# Patient Record
Sex: Female | Born: 1990 | Race: Black or African American | Hispanic: No | Marital: Single | Smoking: Current every day smoker
Health system: Southern US, Community
[De-identification: ages and names within clinical notes are randomized; demographics above are authoritative.]

## PROBLEM LIST (undated history)

## (undated) HISTORY — PX: EYE SURGERY: SHX253

---
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
## 2010-12-18 ENCOUNTER — Inpatient Hospital Stay (INDEPENDENT_AMBULATORY_CARE_PROVIDER_SITE_OTHER)
Admission: RE | Admit: 2010-12-18 | Discharge: 2010-12-18 | Disposition: A | Discharge status: Home / Self Care | Payer: BC Managed Care – PPO | Source: Ambulatory Visit | Attending: Family Medicine | Admitting: Family Medicine

## 2010-12-18 DIAGNOSIS — R6889 Other general symptoms and signs: Secondary | ICD-10-CM

## 2011-01-19 ENCOUNTER — Emergency Department (INDEPENDENT_AMBULATORY_CARE_PROVIDER_SITE_OTHER)
Admission: EM | Admit: 2011-01-19 | Discharge: 2011-01-19 | Disposition: A | Discharge status: Home / Self Care | Payer: BC Managed Care – PPO | Source: Home / Self Care

## 2011-01-19 DIAGNOSIS — J069 Acute upper respiratory infection, unspecified: Secondary | ICD-10-CM

## 2011-01-19 MED ORDER — ONDANSETRON HCL 4 MG PO TABS: 4.0000 mg | ORAL_TABLET | Freq: Four times a day (QID) | ORAL | Status: AC

## 2011-01-19 MED ORDER — FEXOFENADINE-PSEUDOEPHED ER 60-120 MG PO TB12: 1.0000 | ORAL_TABLET | Freq: Two times a day (BID) | ORAL | Status: AC

## 2011-01-19 MED ORDER — IBUPROFEN 800 MG PO TABS: 800.0000 mg | ORAL_TABLET | Freq: Three times a day (TID) | ORAL | Status: AC

## 2011-01-19 NOTE — ED Provider Notes (Signed)
History     CSN: [id number] Arrival date & time: 01/19/2011  3:58 PM   First MD Initiated Contact with Patient 01/19/11 1522      Chief Complaint  Patient presents with  . Fever    1 day hx of fever, chills, nausea, vomitting. Had vomitting last night.  Today has had fever.  Nonproductive cough. has taken Niquil and tylenol without relief    (Consider location/radiation/quality/duration/timing/severity/associated sxs/prior treatment) Patient is a 20 y.o. female presenting with fever. The history is provided by the patient.  Fever Primary symptoms of the febrile illness include fever, cough, nausea and vomiting. Primary symptoms do not include headaches, wheezing, shortness of breath, abdominal pain or rash. The current episode started today. This is a new problem. The problem has not changed since onset. The maximum temperature recorded prior to her arrival was 100 to 100.9 F.  The cough is non-productive and vomit inducing.    History reviewed. No pertinent past medical history.  History reviewed. No pertinent past surgical history.  Family History  Problem Relation Age of Onset  . Cancer Mother   . Hypertension Mother   . Hyperlipidemia Mother     History  Substance Use Topics  . Smoking status: Current Some Day Smoker -- 0.5 packs/day    Types: Cigarettes  . Smokeless tobacco: Never Used  . Alcohol Use: Yes     occational    OB History    Grav Para Term Preterm Abortions TAB SAB Ect Mult Living                  Review of Systems  Constitutional: Positive for fever and chills. Negative for appetite change.  HENT: Positive for congestion. Negative for ear pain, sore throat, rhinorrhea, sneezing, trouble swallowing, neck pain and sinus pressure.   Eyes: Negative for discharge.  Respiratory: Positive for cough. Negative for shortness of breath and wheezing.   Gastrointestinal: Positive for nausea and vomiting. Negative for abdominal pain.       Persistent nausea  vomiting x 1 foot content last night pos tussive.  Skin: Negative for rash.  Neurological: Negative for dizziness and headaches.    Allergies  Review of patient's allergies indicates no known allergies.  Home Medications   Current Outpatient Rx  Name Route Sig Dispense Refill  . FEXOFENADINE-PSEUDOEPHEDRINE 60-120 MG PO TB12 Oral Take 1 tablet by mouth every 12 (twelve) hours. 30 tablet 0  . IBUPROFEN 800 MG PO TABS Oral Take 1 tablet (800 mg total) by mouth 3 (three) times daily. 21 tablet 0    Take with food  . ONDANSETRON HCL 4 MG PO TABS Oral Take 1 tablet (4 mg total) by mouth every 6 (six) hours. 12 tablet 0    BP 134/59  Pulse 99  Temp 100.4 F (38 C)  Resp 16  SpO2 100%  LMP 12/19/2010  Physical Exam  Nursing note and vitals reviewed. Constitutional: She is oriented to person, place, and time. She appears well-developed and well-nourished. No distress.  HENT:  Head: Normocephalic.       Significant pharyngeal erythema no exudates. No uvula deviation. No trismus. TM's with increased vascular markings no swelling or dullness. Impress clear fluid behind right TM.  Eyes: Conjunctivae and EOM are normal. Pupils are equal, round, and reactive to light. Right eye exhibits no discharge. Left eye exhibits no discharge.  Cardiovascular: Normal rate, regular rhythm, normal heart sounds and intact distal pulses.   Pulmonary/Chest: Effort normal and breath sounds normal.  No respiratory distress. She has no wheezes. She exhibits no tenderness.  Abdominal: Soft. She exhibits no distension and no mass. There is no tenderness. There is no rebound and no guarding.  Lymphadenopathy:    She has no cervical adenopathy.  Neurological: She is alert and oriented to person, place, and time.  Skin: No rash noted.    ED Course  Procedures (including critical care time)  Labs Reviewed - No data to display No results found.   1. URI (upper respiratory infection)       MDM  Treated  symptomatically.        Sharin Grave, MD 01/19/11 1700

## 2011-01-19 NOTE — ED Notes (Signed)
Patient did not answer in waiting room. 

## 2011-01-19 NOTE — ED Notes (Signed)
1 day hx of fever, chills, nausea, vomitting. Had vomitting last night.  Today has had fever.  Nonproductive cough.

## 2012-12-04 ENCOUNTER — Emergency Department (HOSPITAL_COMMUNITY)
Admission: EM | Admit: 2012-12-04 | Discharge: 2012-12-04 | Disposition: A | Discharge status: Home / Self Care | Payer: BC Managed Care – PPO | Source: Home / Self Care | Attending: Emergency Medicine | Admitting: Emergency Medicine

## 2012-12-04 ENCOUNTER — Encounter (HOSPITAL_COMMUNITY): Payer: Self-pay | Admitting: Emergency Medicine

## 2012-12-04 ENCOUNTER — Emergency Department (INDEPENDENT_AMBULATORY_CARE_PROVIDER_SITE_OTHER): Payer: BC Managed Care – PPO

## 2012-12-04 DIAGNOSIS — S93409A Sprain of unspecified ligament of unspecified ankle, initial encounter: Secondary | ICD-10-CM

## 2012-12-04 NOTE — ED Notes (Addendum)
Pt c/o of left ankle injury last night. Tripped and rolled ankle. Currently unable to bear weight and ambulate. Has taken OTC pain meds with no relief. Pt denies any numbness or tingling. Pt is alert and oriented.

## 2012-12-04 NOTE — ED Provider Notes (Signed)
CSN: [id number]     Arrival date & time 12/04/12  1515 History   First MD Initiated Contact with Patient 12/04/12 1644     Chief Complaint  Patient presents with  . Ankle Injury   (Consider location/radiation/quality/duration/timing/severity/associated sxs/prior Treatment) HPI Comments: [place], not sure if she rolled her ankle and then fell or how exactly she injured her ankle. Reports this is 3rd time injuring ankle  Patient is a 22 y.o. female presenting with lower extremity injury. The history is provided by the patient.  Ankle Injury This is a new problem. The current episode started 12 to 24 hours ago. The problem occurs constantly. The problem has not changed since onset.The symptoms are aggravated by standing and walking. Nothing relieves the symptoms. Treatments tried: ibuprofen. The treatment provided no relief.    History reviewed. No pertinent past medical history. History reviewed. No pertinent past surgical history. Family History  Problem Relation Age of Onset  . Cancer Mother   . Hypertension Mother   . Hyperlipidemia Mother    History  Substance Use Topics  . Smoking status: Current Some Day Smoker -- 0.50 packs/day    Types: Cigarettes  . Smokeless tobacco: Never Used  . Alcohol Use: Yes     Comment: occational   OB History   Grav Para Term Preterm Abortions TAB SAB Ect Mult Living                 Review of Systems  Musculoskeletal: Positive for joint swelling.       Ankle injury  Skin: Negative for color change and wound.    Allergies  Review of patient's allergies indicates no known allergies.  Home Medications  No current outpatient prescriptions on file. BP 134/82  Pulse 104  Temp(Src) 97.7 F (36.5 C) (Oral)  Resp 18  SpO2 100%  LMP 11/20/2012 Physical Exam  Constitutional: She appears well-developed and well-nourished. No distress.  Musculoskeletal:       Left ankle: She exhibits decreased range  of motion and swelling. Tenderness. Lateral malleolus and medial malleolus tenderness found.  Skin: Skin is warm, dry and intact.    ED Course  Procedures (including critical care time) Labs Review Labs Reviewed - No data to display Imaging Review Dg Ankle Complete Left  12/04/2012   CLINICAL DATA:  Left ankle injury.  EXAM: LEFT ANKLE COMPLETE - 3+ VIEW  COMPARISON:  None.  FINDINGS: No fracture or dislocation is noted. Joint spaces appear normal. Soft tissue swelling is seen over lateral malleolus suggesting ligamentous injury.  IMPRESSION: No fracture or dislocation is noted. Soft tissue swelling is seen over lateral malleolus suggesting ligamentous injury.   Electronically Signed   By: Roque Lias M.D.   On: 12/04/2012 16:35    EKG Interpretation     Ventricular Rate:    PR Interval:    QRS Duration:   QT Interval:    QTC Calculation:   R Axis:     Text Interpretation:              MDM   1. Ankle sprain and strain, left, initial encounter   given crutches and aso. Pt to f/u with [facility] as this is 3rd injury to ankle. Continue ibuprofen. Try soaking in warm water with epsom salt in it.      Cathlyn Parsons, NP 12/04/12 1654

## 2012-12-04 NOTE — ED Provider Notes (Signed)
Medical screening examination/treatment/procedure(s) were performed by non-physician practitioner and as supervising physician I was immediately available for consultation/collaboration.  Leslee Home, M.D.  Reuben Likes, MD 12/04/12 [phone]

## 2013-04-21 ENCOUNTER — Encounter (HOSPITAL_COMMUNITY): Payer: Self-pay | Admitting: Emergency Medicine

## 2013-04-21 ENCOUNTER — Emergency Department (INDEPENDENT_AMBULATORY_CARE_PROVIDER_SITE_OTHER)
Admission: EM | Admit: 2013-04-21 | Discharge: 2013-04-21 | Disposition: A | Discharge status: Home / Self Care | Payer: BC Managed Care – PPO | Source: Home / Self Care | Attending: Family Medicine | Admitting: Family Medicine

## 2013-04-21 DIAGNOSIS — J329 Chronic sinusitis, unspecified: Secondary | ICD-10-CM

## 2013-04-21 DIAGNOSIS — R0982 Postnasal drip: Secondary | ICD-10-CM

## 2013-04-21 LAB — POCT RAPID STREP A: STREPTOCOCCUS, GROUP A SCREEN (DIRECT): NEGATIVE

## 2013-04-21 MED ORDER — IPRATROPIUM BROMIDE 0.06 % NA SOLN: 2.0000 | Freq: Four times a day (QID) | NASAL | Status: AC

## 2013-04-21 NOTE — Discharge Instructions (Signed)
Thank you for coming in today. Use the nasal spray Take over-the-counter allergy and cold medications. Use up to 2 Aleve twice daily for sore throat Call or go to the emergency room if you get worse, have trouble breathing, have chest pains, or palpitations.  Pharyngitis Pharyngitis is redness, pain, and swelling (inflammation) of your pharynx.  CAUSES  Pharyngitis is usually caused by infection. Most of the time, these infections are from viruses (viral) and are part of a cold. However, sometimes pharyngitis is caused by bacteria (bacterial). Pharyngitis can also be caused by allergies. Viral pharyngitis may be spread from person to person by coughing, sneezing, and personal items or utensils (cups, forks, spoons, toothbrushes). Bacterial pharyngitis may be spread from person to person by more intimate contact, such as kissing.  SIGNS AND SYMPTOMS  Symptoms of pharyngitis include:   Sore throat.   Tiredness (fatigue).   Low-grade fever.   Headache.  Joint pain and muscle aches.  Skin rashes.  Swollen lymph nodes.  Plaque-like film on throat or tonsils (often seen with bacterial pharyngitis). DIAGNOSIS  Your health care provider will ask you questions about your illness and your symptoms. Your medical history, along with a physical exam, is often all that is needed to diagnose pharyngitis. Sometimes, a rapid strep test is done. Other lab tests may also be done, depending on the suspected cause.  TREATMENT  Viral pharyngitis will usually get better in 3 4 days without the use of medicine. Bacterial pharyngitis is treated with medicines that kill germs (antibiotics).  HOME CARE INSTRUCTIONS   Drink enough water and fluids to keep your urine clear or pale yellow.   Only take over-the-counter or prescription medicines as directed by your health care provider:   If you are prescribed antibiotics, make sure you finish them even if you start to feel better.   Do not take  aspirin.   Get lots of rest.   Gargle with 8 oz of salt water ( tsp of salt per 1 qt of water) as often as every 1 2 hours to soothe your throat.   Throat lozenges (if you are not at risk for choking) or sprays may be used to soothe your throat. SEEK MEDICAL CARE IF:   You have large, tender lumps in your neck.  You have a rash.  You cough up green, yellow-brown, or bloody spit. SEEK IMMEDIATE MEDICAL CARE IF:   Your neck becomes stiff.  You drool or are unable to swallow liquids.  You vomit or are unable to keep medicines or liquids down.  You have severe pain that does not go away with the use of recommended medicines.  You have trouble breathing (not caused by a stuffy nose). MAKE SURE YOU:   Understand these instructions.  Will watch your condition.  Will get help right away if you are not doing well or get worse. Document Released: 02/09/2005 Document Revised: 11/30/2012 Document Reviewed: 10/17/2012 [facility] Patient Information 2014 [place], [place].

## 2013-04-21 NOTE — ED Notes (Signed)
Reports having postnasal drip that started yesterday.  Mild sore throat.  Denies fever, n/v/d.   Currently using otc allergy meds.

## 2013-04-21 NOTE — ED Provider Notes (Signed)
Gabrielle Schwartz is a 23 y.o. female who presents to Urgent Care today for sore throat nasal discharge present for one day. Patient feels as though her nose is running down the back of her throat. She denies any cough nausea vomiting diarrhea shortness of breath fevers or chills. She has tried some over-the-counter medications which have not been very effective. She feels well otherwise. Symptom to moderate.   History reviewed. No pertinent past medical history. History  Substance Use Topics  . Smoking status: Current Some Day Smoker -- 0.50 packs/day    Types: Cigarettes  . Smokeless tobacco: Never Used  . Alcohol Use: Yes     Comment: occational   ROS as above Medications: No current facility-administered medications for this encounter.   Current Outpatient Prescriptions  Medication Sig Dispense Refill  . ipratropium (ATROVENT) 0.06 % nasal spray Place 2 sprays into both nostrils 4 (four) times daily.  15 mL  1    Exam:  BP 123/72  Pulse 80  Temp(Src) 97.7 F (36.5 C) (Oral)  Resp 20  SpO2 98%  LMP 04/11/2013 Gen: Well NAD HEENT: EOMI,  MMM posterior pharynx with cobblestoning. Tympanic membranes are normal appearing bilaterally. Nasal turbinates are mildly inflamed. Lungs: Normal work of breathing. CTABL Heart: RRR no MRG Abd: NABS, Soft. NT, ND Exts: Brisk capillary refill, warm and well perfused.   Assessment and Plan: 23 y.o. female with postnasal drip likely due to viral cause. Plan to treat with Atrovent nasal spray and over-the-counter cough and cold medications.  Discussed warning signs or symptoms. Please see discharge instructions. Patient expresses understanding.    Rodolph BongEvan S Dawnetta Copenhaver, MD 04/21/13 1055

## 2013-04-23 LAB — CULTURE, GROUP A STREP

## 2014-04-26 ENCOUNTER — Encounter (HOSPITAL_COMMUNITY): Payer: Self-pay

## 2014-04-26 ENCOUNTER — Emergency Department (HOSPITAL_COMMUNITY)
Admission: EM | Admit: 2014-04-26 | Discharge: 2014-04-26 | Disposition: A | Discharge status: Home / Self Care | Payer: BLUE CROSS/BLUE SHIELD | Attending: Emergency Medicine | Admitting: Emergency Medicine

## 2014-04-26 DIAGNOSIS — Z79899 Other long term (current) drug therapy: Secondary | ICD-10-CM | POA: Insufficient documentation

## 2014-04-26 DIAGNOSIS — H578 Other specified disorders of eye and adnexa: Secondary | ICD-10-CM | POA: Diagnosis not present

## 2014-04-26 DIAGNOSIS — Z791 Long term (current) use of non-steroidal anti-inflammatories (NSAID): Secondary | ICD-10-CM | POA: Diagnosis not present

## 2014-04-26 DIAGNOSIS — H5711 Ocular pain, right eye: Secondary | ICD-10-CM | POA: Diagnosis present

## 2014-04-26 DIAGNOSIS — Z720 Tobacco use: Secondary | ICD-10-CM | POA: Diagnosis not present

## 2014-04-26 DIAGNOSIS — H5789 Other specified disorders of eye and adnexa: Secondary | ICD-10-CM

## 2014-04-26 MED ORDER — FLUORESCEIN SODIUM 1 MG OP STRP
1.0000 | ORAL_STRIP | Freq: Once | OPHTHALMIC | Status: DC
  Filled 2014-04-26: qty 1

## 2014-04-26 MED ORDER — TRAMADOL HCL 50 MG PO TABS: 50.0000 mg | ORAL_TABLET | Freq: Four times a day (QID) | ORAL | Status: AC | PRN

## 2014-04-26 MED ORDER — TETRACAINE HCL 0.5 % OP SOLN
1.0000 [drp] | Freq: Once | OPHTHALMIC | Status: DC
  Filled 2014-04-26: qty 2

## 2014-04-26 NOTE — ED Notes (Signed)
Pt complains of eye pain from taking a contact out and putting one back in, she thinks she might have scratched her eye.

## 2014-04-26 NOTE — ED Provider Notes (Signed)
CSN: [id number]     Arrival date & time 04/26/14  0006 History   First MD Initiated Contact with Patient 04/26/14 0105     Chief Complaint  Patient presents with  . Eye Pain     (Consider location/radiation/quality/duration/timing/severity/associated sxs/prior Treatment) Patient is a 24 y.o. female presenting with eye pain. The history is provided by the patient. No language interpreter was used.  Eye Pain This is a new problem. The current episode started today. The problem occurs constantly. The problem has been unchanged. Pertinent negatives include no abdominal pain, anorexia, arthralgias, chills, diaphoresis, fever, joint swelling, rash, sore throat, vertigo or vomiting. Exacerbated by: closing right eye. She has tried nothing for the symptoms. The treatment provided no relief.    History reviewed. No pertinent past medical history. History reviewed. No pertinent past surgical history. Family History  Problem Relation Age of Onset  . Cancer Mother   . Hypertension Mother   . Hyperlipidemia Mother    History  Substance Use Topics  . Smoking status: Current Some Day Smoker -- 0.50 packs/day    Types: Cigarettes  . Smokeless tobacco: Never Used  . Alcohol Use: Yes     Comment: occational   OB History    No data available     Review of Systems  Constitutional: Negative for fever, chills and diaphoresis.  HENT: Negative for sore throat.   Eyes: Positive for pain.  Gastrointestinal: Negative for vomiting, abdominal pain and anorexia.  Musculoskeletal: Negative for joint swelling and arthralgias.  Skin: Negative for rash.  Neurological: Negative for vertigo.  All other systems reviewed and are negative.     Allergies  Review of patient's allergies indicates no known allergies.  Home Medications   Prior to Admission medications   Medication Sig Start Date End Date Taking? Authorizing Provider  naphazoline-pheniramine (NAPHCON-A) 0.025-0.3 % ophthalmic solution Place  2 drops into the right eye 4 (four) times daily as needed for irritation.   Yes Historical Provider, MD  naproxen sodium (ANAPROX) 220 MG tablet Take 440 mg by mouth 2 (two) times daily as needed (pain).   Yes Historical Provider, MD  ipratropium (ATROVENT) 0.06 % nasal spray Place 2 sprays into both nostrils 4 (four) times daily. 04/21/13   Rodolph BongEvan S Corey, MD   BP 134/111 mmHg  Pulse 101  Temp(Src) 97.9 F (36.6 C) (Oral)  Resp 20  Ht 5\' 3"  (1.6 m)  SpO2 100%  LMP 04/18/2014 Physical Exam  Constitutional: She is oriented to person, place, and time. She appears well-developed and well-nourished. No distress.  HENT:  Head: Normocephalic and atraumatic.  Eyes: Conjunctivae and EOM are normal. Pupils are equal, round, and reactive to light.  Watery drainage from right eye.   Neck: Normal range of motion.  Cardiovascular: Normal rate and regular rhythm.  Exam reveals no gallop and no friction rub.   No murmur heard. Pulmonary/Chest: Effort normal and breath sounds normal. She has no wheezes. She has no rales. She exhibits no tenderness.  Abdominal: Soft. She exhibits no distension. There is no tenderness. There is no rebound.  Musculoskeletal: Normal range of motion.  Neurological: She is alert and oriented to person, place, and time. Coordination normal.  Speech is goal-oriented. Moves limbs without ataxia.   Skin: Skin is warm and dry.  Psychiatric: She has a normal mood and affect. Her behavior is normal.  Nursing note and vitals reviewed.   ED Course  Procedures (including critical care time) Labs Review Labs Reviewed - No  data to display  Imaging Review No results found.   EKG Interpretation None      MDM   Final diagnoses:  Irritation of right eye    1:43 AM Fluorescin dye exam shows no corneal abrasion or foreign body. Vitals stable and patient afebrile. No visual disturbance.   Emilia Beck, PA-C 04/26/14 0148  Dione Booze, MD 04/26/14 [phone]

## 2014-04-26 NOTE — Discharge Instructions (Signed)
Take Tramadol as needed for pain. Wear your glasses for the next 3 days. Return to the ED with worsening or concerning symptoms.

## 2017-01-13 DIAGNOSIS — Z0000 Encounter for general adult medical examination without abnormal findings: Secondary | ICD-10-CM | POA: Diagnosis not present

## 2017-01-13 DIAGNOSIS — E785 Hyperlipidemia, unspecified: Secondary | ICD-10-CM | POA: Diagnosis not present

## 2017-03-29 ENCOUNTER — Other Ambulatory Visit (HOSPITAL_COMMUNITY)
Admission: RE | Admit: 2017-03-29 | Discharge: 2017-03-29 | Disposition: A | Discharge status: Home / Self Care | Payer: BLUE CROSS/BLUE SHIELD | Source: Ambulatory Visit | Attending: Family Medicine | Admitting: Family Medicine

## 2017-03-29 ENCOUNTER — Other Ambulatory Visit: Payer: Self-pay | Admitting: Physician Assistant

## 2017-03-29 DIAGNOSIS — Z01419 Encounter for gynecological examination (general) (routine) without abnormal findings: Secondary | ICD-10-CM | POA: Insufficient documentation

## 2017-03-31 LAB — CYTOLOGY - PAP
Adequacy: ABSENT
DIAGNOSIS: NEGATIVE

## 2017-04-05 DIAGNOSIS — N898 Other specified noninflammatory disorders of vagina: Secondary | ICD-10-CM | POA: Diagnosis not present

## 2017-06-25 DIAGNOSIS — H40013 Open angle with borderline findings, low risk, bilateral: Secondary | ICD-10-CM | POA: Diagnosis not present

## 2017-06-25 DIAGNOSIS — H33322 Round hole, left eye: Secondary | ICD-10-CM | POA: Diagnosis not present

## 2017-07-09 DIAGNOSIS — H33312 Horseshoe tear of retina without detachment, left eye: Secondary | ICD-10-CM | POA: Diagnosis not present

## 2017-07-13 DIAGNOSIS — H33312 Horseshoe tear of retina without detachment, left eye: Secondary | ICD-10-CM | POA: Diagnosis not present

## 2017-07-20 DIAGNOSIS — H33321 Round hole, right eye: Secondary | ICD-10-CM | POA: Diagnosis not present

## 2017-07-30 ENCOUNTER — Emergency Department (HOSPITAL_COMMUNITY): Payer: 59

## 2017-07-30 ENCOUNTER — Encounter (HOSPITAL_COMMUNITY): Payer: Self-pay

## 2017-07-30 ENCOUNTER — Other Ambulatory Visit: Payer: Self-pay

## 2017-07-30 ENCOUNTER — Emergency Department (HOSPITAL_COMMUNITY)
Admission: EM | Admit: 2017-07-30 | Discharge: 2017-07-30 | Disposition: A | Discharge status: Home / Self Care | Payer: 59 | Attending: Emergency Medicine | Admitting: Emergency Medicine

## 2017-07-30 DIAGNOSIS — R079 Chest pain, unspecified: Secondary | ICD-10-CM | POA: Diagnosis not present

## 2017-07-30 DIAGNOSIS — M79631 Pain in right forearm: Secondary | ICD-10-CM | POA: Diagnosis not present

## 2017-07-30 DIAGNOSIS — R0789 Other chest pain: Secondary | ICD-10-CM | POA: Insufficient documentation

## 2017-07-30 DIAGNOSIS — F17210 Nicotine dependence, cigarettes, uncomplicated: Secondary | ICD-10-CM | POA: Insufficient documentation

## 2017-07-30 DIAGNOSIS — M79601 Pain in right arm: Secondary | ICD-10-CM | POA: Diagnosis not present

## 2017-07-30 LAB — BASIC METABOLIC PANEL
Anion gap: 8 (ref 5–15)
BUN: 17 mg/dL (ref 6–20)
CALCIUM: 9.4 mg/dL (ref 8.9–10.3)
CO2: 24 mmol/L (ref 22–32)
CREATININE: 0.78 mg/dL (ref 0.44–1.00)
Chloride: 106 mmol/L (ref 101–111)
GFR calc non Af Amer: >60 mL/min (ref >60)
Glucose, Bld: 82 mg/dL (ref 65–99)
Potassium: 4.1 mmol/L (ref 3.5–5.1)
SODIUM: 138 mmol/L (ref 135–145)

## 2017-07-30 LAB — CBC
HCT: 34.9 % — ABNORMAL LOW (ref 36.0–46.0)
Hemoglobin: 11.2 g/dL — ABNORMAL LOW (ref 12.0–15.0)
MCH: 23.1 pg — ABNORMAL LOW (ref 26.0–34.0)
MCHC: 32.1 g/dL (ref 30.0–36.0)
MCV: 72.1 fL — ABNORMAL LOW (ref 78.0–100.0)
Platelets: 378 10*3/uL (ref 150–400)
RBC: 4.84 MIL/uL (ref 3.87–5.11)
RDW: 15.4 % (ref 11.5–15.5)
WBC: 9.8 10*3/uL (ref 4.0–10.5)

## 2017-07-30 LAB — I-STAT TROPONIN, ED: Troponin i, poc: 0.01 ng/mL (ref 0.00–0.08)

## 2017-07-30 LAB — I-STAT BETA HCG BLOOD, ED (MC, WL, AP ONLY): I-stat hCG, quantitative: <5 m[IU]/mL (ref <5)

## 2017-07-30 NOTE — ED Notes (Signed)
ED Provider at bedside. 

## 2017-07-30 NOTE — ED Triage Notes (Signed)
Patient ac/o right arm pain x 3 days. Patient denies any injury. Patient c/o right upper chest pain that began last night. Patient denies any SOB, nausea, diaphoresis.

## 2017-08-02 NOTE — ED Provider Notes (Signed)
[facility]-EMERGENCY DEPT Provider Note   CSN: [id number] Arrival date & time: 07/30/17  1008     History   Chief Complaint Chief Complaint  Patient presents with  . Arm Pain  . Chest Pain    HPI Gabrielle Schwartz is a 27 y.o. female.  HPI   26yF with R arm pain and R upper chest pain. ONset 3d ago. Pain is from forearm down into hand. CP is R anterior chest. Both are intermittent and don't necessarily coincide with each other. No numbness or tingling. Denies acute injury/strain. No fever or chills.   History reviewed. No pertinent past medical history.  There are no active problems to display for this patient.   Past Surgical History:  Procedure Laterality Date  . EYE SURGERY       OB History   None      Home Medications    Prior to Admission medications   Medication Sig Start Date End Date Taking? Authorizing Provider  ipratropium (ATROVENT) 0.06 % nasal spray Place 2 sprays into both nostrils 4 (four) times daily. 04/21/13   Rodolph Bong, MD  naphazoline-pheniramine (NAPHCON-A) 0.025-0.3 % ophthalmic solution Place 2 drops into the right eye 4 (four) times daily as needed for irritation.    [provider]  naproxen sodium (ANAPROX) 220 MG tablet Take 440 mg by mouth 2 (two) times daily as needed (pain).    [provider]  traMADol (ULTRAM) 50 MG tablet Take 1 tablet (50 mg total) by mouth every 6 (six) hours as needed. 04/26/14   Emilia Beck, PA-C    Family History Family History  Problem Relation Age of Onset  . Cancer Mother   . Hypertension Mother   . Hyperlipidemia Mother     Social History Social History   Tobacco Use  . Smoking status: Current Every Day Smoker    Packs/day: 0.00    Types: Cigars  . Smokeless tobacco: Never Used  Substance Use Topics  . Alcohol use: Yes    Comment: occassional  . Drug use: Yes    Types: Marijuana    Comment: monthly     Allergies   Patient has no known  allergies.   Review of Systems Review of Systems  All systems reviewed and negative, other than as noted in HPI.  Physical Exam Updated Vital Signs BP (!) 149/50 (BP Location: Right Arm)   Pulse (!) 101   Temp 98 F (36.7 C) (Oral)   Resp 18   Ht 5\' 4"  (1.626 m)   Wt 131.5 kg (290 lb)   LMP 07/16/2017   SpO2 98%   BMI 49.78 kg/m   Physical Exam  Constitutional: She appears well-developed and well-nourished. No distress.  HENT:  Head: Normocephalic and atraumatic.  Eyes: Conjunctivae are normal. Right eye exhibits no discharge. Left eye exhibits no discharge.  Neck: Neck supple.  Cardiovascular: Normal rate, regular rhythm and normal heart sounds. Exam reveals no gallop and no friction rub.  No murmur heard. Pulmonary/Chest: Effort normal and breath sounds normal. No respiratory distress.  Abdominal: Soft. She exhibits no distension. There is no tenderness.  Musculoskeletal: She exhibits no edema or tenderness.  Neurological: She is alert.  Skin: Skin is warm and dry.  Psychiatric: She has a normal mood and affect. Her behavior is normal. Thought content normal.  Nursing note and vitals reviewed.    ED Treatments / Results  Labs (all labs ordered are listed, but only abnormal results are displayed)  Labs Reviewed  CBC - Abnormal; Notable for the following components:      Result Value   Hemoglobin 11.2 (*)    HCT 34.9 (*)    MCV 72.1 (*)    MCH 23.1 (*)    All other components within normal limits  BASIC METABOLIC PANEL  I-STAT TROPONIN, ED  I-STAT BETA HCG BLOOD, ED ([facility], [facility], [facility] ONLY)    EKG EKG Interpretation  Date/Time:  Friday July 30 2017 10:16:38 EDT Ventricular Rate:  93 PR Interval:    QRS Duration: 81 QT Interval:  348 QTC Calculation: 433 R Axis:   46 Text Interpretation:  Sinus rhythm Borderline short PR interval Baseline wander in lead(s) V1 V2 V3 V4 V5 V6 No old tracing to compare Confirmed by Raeford RazorKohut, Delylah Stanczyk [phone]) on 07/30/2017 11:42:04 AM  Also confirmed by Raeford RazorKohut, Sharifah Champine [phone]), editor Sheppard EvensSimpson, Miranda ([phone])  on 07/30/2017 2:42:14 PM   Radiology No results found.  Procedures Procedures (including critical care time)  Medications Ordered in ED Medications - No data to display   Initial Impression / Assessment and Plan / ED Course  I have reviewed the triage vital signs and the nursing notes.  Pertinent labs & imaging results that were available during my care of the patient were reviewed by me and considered in my medical decision making (see chart for details).     Atypical CP. I doubt ACS, PE, dissection or other emergent process. I'm not sure if arm pain related. Nonfocal exam. I doubt CVA. Symptoms from elbow distally which seems more consistent with peripheral process. NVI.  Final Clinical Impressions(s) / ED Diagnoses   Final diagnoses:  Chest pain, unspecified type  Right arm pain    ED Discharge Orders    None       Raeford RazorKohut, Ann-Marie Kluge, MD 08/03/17 1308

## 2018-02-03 DIAGNOSIS — N644 Mastodynia: Secondary | ICD-10-CM | POA: Diagnosis not present

## 2018-02-06 DIAGNOSIS — R079 Chest pain, unspecified: Secondary | ICD-10-CM | POA: Diagnosis not present

## 2018-02-07 ENCOUNTER — Other Ambulatory Visit: Payer: Self-pay | Admitting: Family Medicine

## 2018-02-07 DIAGNOSIS — N6310 Unspecified lump in the right breast, unspecified quadrant: Secondary | ICD-10-CM

## 2018-02-25 DIAGNOSIS — Z0189 Encounter for other specified special examinations: Secondary | ICD-10-CM | POA: Diagnosis not present

## 2018-02-25 DIAGNOSIS — R0789 Other chest pain: Secondary | ICD-10-CM | POA: Diagnosis not present

## 2018-02-25 DIAGNOSIS — R9431 Abnormal electrocardiogram [ECG] [EKG]: Secondary | ICD-10-CM | POA: Diagnosis not present

## 2018-03-03 ENCOUNTER — Ambulatory Visit
Admission: RE | Admit: 2018-03-03 | Discharge: 2018-03-03 | Disposition: A | Discharge status: Home / Self Care | Payer: 59 | Source: Ambulatory Visit | Attending: Family Medicine | Admitting: Family Medicine

## 2018-03-03 DIAGNOSIS — N6489 Other specified disorders of breast: Secondary | ICD-10-CM | POA: Diagnosis not present

## 2018-03-03 DIAGNOSIS — N6310 Unspecified lump in the right breast, unspecified quadrant: Secondary | ICD-10-CM

## 2018-05-13 DIAGNOSIS — E785 Hyperlipidemia, unspecified: Secondary | ICD-10-CM | POA: Diagnosis not present

## 2018-05-13 DIAGNOSIS — D649 Anemia, unspecified: Secondary | ICD-10-CM | POA: Diagnosis not present

## 2018-05-13 DIAGNOSIS — Z23 Encounter for immunization: Secondary | ICD-10-CM | POA: Diagnosis not present

## 2018-05-13 DIAGNOSIS — Z0000 Encounter for general adult medical examination without abnormal findings: Secondary | ICD-10-CM | POA: Diagnosis not present

## 2020-03-11 NOTE — Progress Notes (Unsigned)
Telehealth Encounter (Email link to  [email or web address] ) PCP Clementeen Graham, PA, [facility] I connected with TALAYSIA PINHEIRO (MRN [id number]) on 03/12/2020 by MyChart video-enabled, HIPAA-compliant telemedicine application, verified that I was speaking with the correct person using two identifiers, and that the patient was in a private environment conducive to confidentiality.  The patient agreed to proceed.  Persons participating in visit were patient and provider (registered dietitian) Linna Darner, PhD, RD, LDN, CEDRD. Provider was located at [facility] during this telehealth encounter; patient was at home.  Appt start time: 1600 end time: 1700 (1 hour)  Reason for telehealth visit: Referred by Clementeen Graham, PA for Medical Nutrition Therapy related to obesity (BMI 45-49.9).  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   She has a h/o obesity, HLD, oligomenorrhea, and anemia.  Ht and wt on 02/14/20: Ht 65", Wt 287 lb, BMI 47.75.   She is interested in establishing a healthy relationship with food, and would like to explore intermittent fasting, which she had done in 2019 (mostly 16/8 intervals), along with a keto diet.  She lost ~60 lb in about 7 months, half of which she has regained.  She does not want to practice a keto diet again, concerned that it increased her LDL, and is difficult to sustain.  Assessment:  Usual eating pattern: 2-3 meals and 0-1 snack per day. Frequent foods and beverages: water, fruit juice; chx tacos, [place] burgers, rice meals, sandwiches, fruit.   Avoided foods: most pork, red meat, tries to limit carbs, esp refined carb and sugar (but struggles with sugar); has been trying to avoid soda.   Usual physical activity: none currently; did go  to the gym today.  Tries to walk a fair amt at work.  Has an Apple watch, but hasn't used recently.   Sleep: Estimates average of 6-7 hours of sleep/night.  24-hr recall:  (Up at 9:30 AM; drank water soon after getting up) Went to gym; worked out on TM 20 min, 10 min mostly upper body weights, 3 min step-ups.  B ( AM)-  water Snk ( AM)-  --- L ( PM)-  water Snk ( PM)-  --- D (6:30 PM)-  Ham (2-3 oz) lettuce wrap, mayo, must, tomato,  Snk (8 PM)-  1/2 c pineapple fruit cup, water Typical day? No.  Has just started trying to restrict intake again.  More typical day includes more food, but Makyia didn't have much on hand during yesterday's snow conditions.    Intervention: Completed diet and exercise history, and established behavioral goals.   For recommendations and goals, see Patient Instructions.    Follow-up: telehealth appt in 4 weeks.    SYKES,JEANNIE

## 2020-03-12 ENCOUNTER — Other Ambulatory Visit: Payer: Self-pay

## 2020-03-12 ENCOUNTER — Ambulatory Visit (INDEPENDENT_AMBULATORY_CARE_PROVIDER_SITE_OTHER): Payer: BC Managed Care – PPO | Admitting: Family Medicine

## 2020-03-12 DIAGNOSIS — E661 Drug-induced obesity: Secondary | ICD-10-CM

## 2020-03-12 DIAGNOSIS — Z6841 Body Mass Index (BMI) 40.0 and over, adult: Secondary | ICD-10-CM

## 2020-03-12 NOTE — Patient Instructions (Addendum)
Note: TASTE PREFERENCES ARE LEARNED.  This means it will get easier to choose foods you know are good for you if you are exposed to them enough.    Outcome goal is to feel and function as well as possible.   Intermittent fasting:  Continue to eat within a 10-hour time frame per 24 hours, aiming for eating between 12 PM and 10 PM.  (Once you finish your certification program, you may want to move this window-of-eating time up by a couple of hours.)  If you are eating close to bedtime, it is probably best to get most of your carb foods earlier in the day rather than having the last meal of the day be carb-heavy.    Behavioral Goals: 1. Drink at least 60 oz of water per day.       - Place a glass of water in the bathroom before you go to bed, and drink that first thing in the morning.  You may need to start with just a small amount, and build up to 12-16 oz.    2. Get at least 45 minutes of exercise 3 times a week.  This may be a strength workout, or aerobic type of workout at the gym, or it could be walk in the neighborhood.       - Document the number of minutes you exercise each time you exercise on a calendar or planner.       - Aim for consistency - exercise on the same days of the week.    3. Obtain twice the volume of vegetables as either protein or starchy foods for both lunch and dinner.     You may want to consider this your goal for only a specific number of days per week (rather than 7).  Give it a try, and determine what seems best for you.  While eating this way 7 days a week is ideal, we always have to consider the realities and logistics of day-to-day life.       To increase vegetables:     - Try roasting vegetables.      - Try sauteeing dark green leafy vegetables.  You may like best some of the BABY greens rather than big leaves.       - Keep on hand frozen vegetables, which you can microwave for a quick veg serving.       - If you use canned soup, first microwave frozen  vegetables, then add soup, and reheat.    - Follow-up video appt on Monday, 1/14 at 4 PM.

## 2020-04-08 ENCOUNTER — Ambulatory Visit (INDEPENDENT_AMBULATORY_CARE_PROVIDER_SITE_OTHER): Payer: BC Managed Care – PPO | Admitting: Family Medicine

## 2020-04-08 ENCOUNTER — Other Ambulatory Visit: Payer: Self-pay

## 2020-04-08 NOTE — Progress Notes (Signed)
Telehealth Encounter (Email link to [email or web address] ) PCP Maude Leriche, PA ([facility])  I connected with MASHELL SIEBEN (MRN [id number]) on 04/08/2020 by MyChart video-enabled, HIPAA-compliant telemedicine application, verified that I was speaking with the correct person using two identifiers, and that the patient was in a private environment conducive to confidentiality.  The patient agreed to proceed.  Persons participating in visit were patient and provider (registered dietitian) Kennith Center, PhD, RD, LDN, CEDRD.  Provider was located at [place] during this telehealth encounter; patient was at home.  Appt start time: 1600 end time: 1700 (1 hour)  Reason for telehealth visit: Referred by Maude Leriche, PA for Medical Nutrition Therapy related to obesity.  On 02/14/20: Ht 65"; wt 287 lb; BMI 47.75.   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  She has a h/o obesity, HLD, oligomenorrhea, and anemia.  Ht and wt on 02/14/20: Ht 65", Wt 287 lb, BMI 47.75.   She is interested in establishing a healthy relationship with food, and would like to explore intermittent fasting, which she had done in 2019 (mostly 16/8 intervals), along with a keto diet.  She lost ~60 lb in about 7 months, half of which she has regained.  She does not want to practice a keto diet again, concerned that it increased her LDL, and is difficult to sustain.  Assessment: Ms. Birkey asked about use of Ozempic for appetite mgmt, which some of her family members are currently using.  We discussed some of the many factors that influence food choices, including physiological (e.g., "fast-" vs. "slow-"digesting carb's), adaptation (getting accustomed to eating a certain way), habit, and family/cultural norms.  Of  these, GLP-1 agonists address only some of the physiological factors, and while very important, will usually provide appetite control only while continuing medication.  She agreed to explore drivers of her food choices before pursuing appetite control with medication.   Usual eating pattern: 3 meals and 0-2 snacks per day. Usual physical activity: 45+ min of gym workouts 3 X wk. Sleep: Estimates average of 6-7 hours of sleep/night.  Progress on goals:  60 oz of water/day:   Meeting goals most days.  45 minutes ex 3 X wk Meeting goal, at least 50-60 min each time Veg's at lunch & dinner Met goal first week, not as well next two weeks.   24-hr recall:  (Up at 9 AM) B ( AM)-  --- Snk ( AM)-  12 oz flavored water L (1:30 PM)-  8" sub salami, ham, pepperoni, chs, arugula, mayo, sandwich, 12 oz ginger ale Snk ( PM)-  --- D (10 PM)-  Chinese: pork-fried rice, egg soup, 1 egg roll, 20 oz Pepsi Snk ( PM)-  --- Typical day? No. More typical day includes 3 meals, and yesterday was her first time drinking soda since her last appt (celebrated Valentine's Day).    Intervention: Reviewed diet and exercise history, and discussed medication use for appetite mgmt, agreeing to first explore drivers of food choices - and ways to address - before considering medication to control appetite.    For recommendations and goals, see Patient Instructions.    Follow-up: Telehealth appt in 4 weeks.    Yotam Rhine,JEANNIE

## 2020-04-08 NOTE — Patient Instructions (Addendum)
Behavioral Goals: 1. Drink at least 60 oz of water per day.       - Remember to aim for most water intake early in the day.    2. Get at least 45 minutes of exercise 3 times a week AND walk at least 20 min 3 X week.          - Aim for consistency - exercise on the same days of the week.    3. Obtain twice the volume of vegetables as either protein or starchy foods for both lunch & dinner at least 5 days a week.     To increase vegetables:     - Try roasting vegetables.      - Try sauteeing dark green leafy vegetables.  You may like best some of the BABY greens rather than big leaves.       - Keep on hand frozen vegetables, which you can microwave for a quick veg serving.       - If you use canned soup, first microwave frozen vegetables, then add soup, and reheat.    NOTE: Pay attention to what is behind your food choices: What you eat, when you eat, and what your portions are.  For example, are you choosing a certain portion size out of habit, or b/c you need to use up leftovers?  Are you eating something you would normally not eat, but are too hungry right now to resist?  Are you eating for comfort, or some emotion?   Let's talk about this at your next appt.   - Consider the 3 Qs of a good food decision: Especially if struggling with a  food decision, ask yourself ALL THREE questions (not one or two): 1. What am I in the mood for? 2. How hungry am I? 3. What's good for me? Keep in mind that "what is good for you" can be interpreted in very different ways depending on the circumstances.  At one time the answer might be an apple, while at another time, the answer might be a scoop of ice cream.  The best food decision includes optimizing satisfaction.    - Document progress on your goals using the Goals Sheet provided today.  Have this handy at your follow-up video appt on Thursday, March 17 at 4 PM.

## 2020-05-09 ENCOUNTER — Encounter: Payer: BC Managed Care – PPO | Admitting: Family Medicine

## 2020-05-09 ENCOUNTER — Other Ambulatory Visit: Payer: Self-pay

## 2020-05-09 NOTE — Progress Notes (Signed)
This encounter was created in error - please disregard.

## 2020-05-19 IMAGING — US ULTRASOUND RIGHT BREAST LIMITED
1 series · 2 of 2 positions shown · non-contrast
Comparison: None.

CLINICAL DATA: 27-year-old female with a palpable area of concern
in the inner right breast.

EXAM:
ULTRASOUND OF THE RIGHT BREAST

[Series 1: ultrasound right breast limited · 0.07mm/px · 2 of 2 slices shown]
[im 1/2]
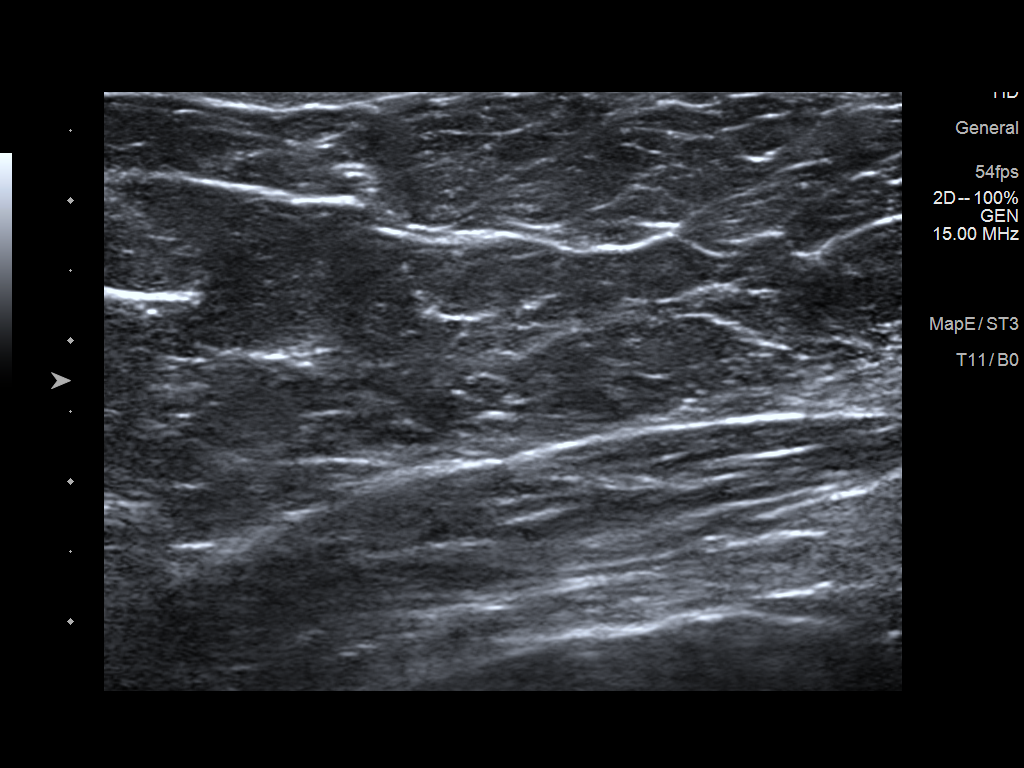
[im 2/2]
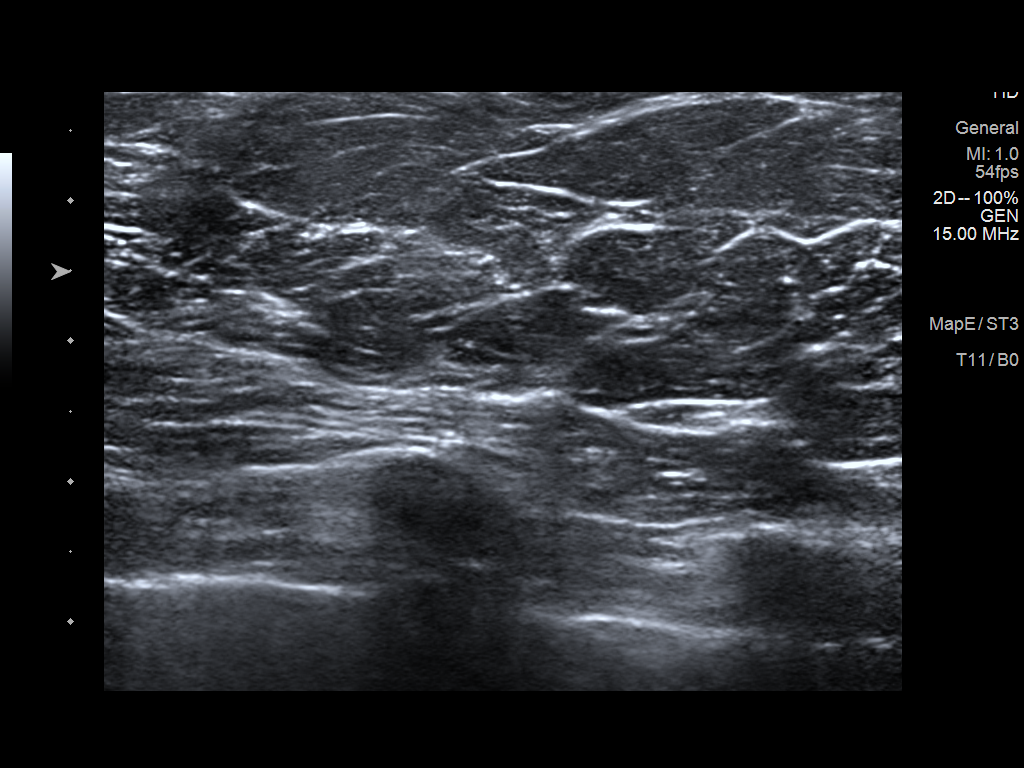

[2 of 2 positions shown; findings below may reference images not displayed]

FINDINGS: Physical examination reveals mild erythema involving the upper inner
quadrant of the right breast at site of a skin infection/boil which
the patient states has since resolved. No underlying palpable
masses. Targeted ultrasound of the inner right breast was performed.
No suspicious masses or abnormality seen, only heterogeneous
fibroglandular tissue identified.
IMPRESSION: No abnormalities identified at site of concern in the right breast.

RECOMMENDATION:
1. Recommend further management of the right breast palpable
abnormality be based on clinical assessment.

2. Screening mammogram at age 38 unless there are persistent or
intervening clinical concerns. (Code:1X-H-U20)

I have discussed the findings and recommendations with the patient.
Results were also provided in writing at the conclusion of the
visit. If applicable, a reminder letter will be sent to the patient
regarding the next appointment.

BI-RADS CATEGORY  1: Negative.

## 2024-01-05 ENCOUNTER — Encounter: Payer: Self-pay | Admitting: Nurse Practitioner

## 2024-01-06 ENCOUNTER — Other Ambulatory Visit: Payer: Self-pay | Admitting: Nurse Practitioner

## 2024-01-06 DIAGNOSIS — N6321 Unspecified lump in the left breast, upper outer quadrant: Secondary | ICD-10-CM

## 2024-01-13 ENCOUNTER — Other Ambulatory Visit: Payer: Self-pay | Admitting: Medical Genetics

## 2024-01-19 ENCOUNTER — Other Ambulatory Visit: Payer: Self-pay

## 2024-01-19 ENCOUNTER — Encounter

## 2024-01-25 ENCOUNTER — Other Ambulatory Visit: Payer: Self-pay

## 2024-01-25 ENCOUNTER — Encounter

## 2024-02-25 ENCOUNTER — Other Ambulatory Visit: Payer: Self-pay

## 2024-02-25 ENCOUNTER — Encounter

## 2024-03-02 ENCOUNTER — Other Ambulatory Visit

## 2024-03-08 ENCOUNTER — Ambulatory Visit
Admission: RE | Admit: 2024-03-08 | Discharge: 2024-03-08 | Disposition: A | Discharge status: Home / Self Care | Payer: Self-pay | Source: Ambulatory Visit | Attending: Nurse Practitioner | Admitting: Nurse Practitioner

## 2024-03-08 ENCOUNTER — Other Ambulatory Visit: Payer: Self-pay | Admitting: Nurse Practitioner

## 2024-03-08 ENCOUNTER — Ambulatory Visit
Admission: RE | Admit: 2024-03-08 | Discharge: 2024-03-08 | Disposition: A | Discharge status: Home / Self Care | Source: Ambulatory Visit | Attending: Nurse Practitioner | Admitting: Nurse Practitioner

## 2024-03-08 DIAGNOSIS — N63 Unspecified lump in unspecified breast: Secondary | ICD-10-CM

## 2024-03-08 DIAGNOSIS — N6321 Unspecified lump in the left breast, upper outer quadrant: Secondary | ICD-10-CM

## 2024-03-11 ENCOUNTER — Other Ambulatory Visit: Payer: Self-pay

## 2024-03-20 ENCOUNTER — Other Ambulatory Visit: Payer: Self-pay

## 2024-03-31 ENCOUNTER — Other Ambulatory Visit: Admission: RE | Admit: 2024-03-31 | Payer: Self-pay | Source: Ambulatory Visit
